# Patient Record
Sex: Female | Born: 1990 | Race: Black or African American | Hispanic: No | Marital: Single
Health system: Southern US, Community
[De-identification: ages and names within clinical notes are randomized; demographics above are authoritative.]

---

## 2010-10-19 ENCOUNTER — Ambulatory Visit: Payer: Self-pay | Admitting: Internal Medicine

## 2010-10-21 ENCOUNTER — Ambulatory Visit (HOSPITAL_COMMUNITY): Admission: RE | Admit: 2010-10-21 | Discharge: 2010-10-21 | Payer: Self-pay | Admitting: Obstetrics & Gynecology

## 2010-10-25 LAB — CBC WITH DIFFERENTIAL/PLATELET
BASO%: 0.3 % (ref 0.0–2.0)
EOS%: 2 % (ref 0.0–7.0)
MCH: 26.8 pg (ref 25.1–34.0)
MCHC: 34.3 g/dL (ref 31.5–36.0)
MCV: 78 fL — ABNORMAL LOW (ref 79.5–101.0)
MONO%: 9 % (ref 0.0–14.0)
NEUT#: 5.2 10*3/uL (ref 1.5–6.5)
RBC: 3.6 10*6/uL — ABNORMAL LOW (ref 3.70–5.45)
RDW: 15.3 % — ABNORMAL HIGH (ref 11.2–14.5)

## 2010-10-25 LAB — COMPREHENSIVE METABOLIC PANEL
AST: 18 U/L (ref 0–37)
Albumin: 3.6 g/dL (ref 3.5–5.2)
Alkaline Phosphatase: 158 U/L — ABNORMAL HIGH (ref 39–117)
Potassium: 3.7 mEq/L (ref 3.5–5.3)
Sodium: 139 mEq/L (ref 135–145)
Total Bilirubin: 0.2 mg/dL — ABNORMAL LOW (ref 0.3–1.2)
Total Protein: 6.5 g/dL (ref 6.0–8.3)

## 2010-10-27 ENCOUNTER — Encounter (INDEPENDENT_AMBULATORY_CARE_PROVIDER_SITE_OTHER): Payer: Self-pay | Admitting: *Deleted

## 2010-10-27 ENCOUNTER — Ambulatory Visit: Payer: Self-pay | Admitting: Obstetrics & Gynecology

## 2010-11-02 LAB — CBC WITH DIFFERENTIAL/PLATELET
Basophils Absolute: 0 10*3/uL (ref 0.0–0.1)
EOS%: 0 % (ref 0.0–7.0)
LYMPH%: 8.5 % — ABNORMAL LOW (ref 14.0–49.7)
MCH: 25.4 pg (ref 25.1–34.0)
MCV: 78.2 fL — ABNORMAL LOW (ref 79.5–101.0)
MONO%: 0.6 % (ref 0.0–14.0)
Platelets: 276 10*3/uL (ref 145–400)
RBC: 3.96 10*6/uL (ref 3.70–5.45)
RDW: 15.3 % — ABNORMAL HIGH (ref 11.2–14.5)

## 2010-11-03 ENCOUNTER — Ambulatory Visit: Payer: Self-pay | Admitting: Obstetrics and Gynecology

## 2010-11-03 LAB — HEPATITIS PANEL, ACUTE: Hepatitis B Surface Ag: NEGATIVE

## 2010-11-03 LAB — HIV ANTIBODY (ROUTINE TESTING W REFLEX): HIV: NONREACTIVE

## 2010-11-04 ENCOUNTER — Encounter: Payer: Self-pay | Admitting: Obstetrics & Gynecology

## 2010-11-17 ENCOUNTER — Ambulatory Visit: Payer: Self-pay | Admitting: Family Medicine

## 2010-11-17 ENCOUNTER — Encounter: Payer: Self-pay | Admitting: Physician Assistant

## 2010-11-17 LAB — CONVERTED CEMR LAB
AST: 12 units/L (ref 0–37)
Alkaline Phosphatase: 125 units/L — ABNORMAL HIGH (ref 39–117)
BUN: 13 mg/dL (ref 6–23)
Calcium: 8.7 mg/dL (ref 8.4–10.5)
Chloride: 104 meq/L (ref 96–112)
Creatinine, Ser: 0.8 mg/dL (ref 0.40–1.20)
HCT: 29.2 % — ABNORMAL LOW (ref 36.0–46.0)
Hemoglobin: 9.1 g/dL — ABNORMAL LOW (ref 12.0–15.0)
MCHC: 31.2 g/dL (ref 30.0–36.0)
MCV: 79.3 fL (ref 78.0–100.0)
RDW: 16.2 % — ABNORMAL HIGH (ref 11.5–15.5)

## 2010-11-18 ENCOUNTER — Encounter: Payer: Self-pay | Admitting: Family Medicine

## 2010-11-18 LAB — CONVERTED CEMR LAB
Collection Interval-CRCL: 24 hr
Creatinine, Urine: 85.1 mg/dL
Protein, Ur: 123 mg/24hr — ABNORMAL HIGH (ref 50–100)

## 2010-11-24 ENCOUNTER — Ambulatory Visit: Payer: Self-pay | Admitting: Internal Medicine

## 2010-11-24 ENCOUNTER — Ambulatory Visit: Payer: Self-pay | Admitting: Obstetrics & Gynecology

## 2010-11-24 ENCOUNTER — Encounter: Payer: Self-pay | Admitting: Physician Assistant

## 2010-11-24 ENCOUNTER — Observation Stay (HOSPITAL_COMMUNITY)
Admission: AD | Admit: 2010-11-24 | Discharge: 2010-11-25 | Payer: Self-pay | Source: Home / Self Care | Attending: Obstetrics and Gynecology | Admitting: Obstetrics and Gynecology

## 2010-11-28 LAB — CBC WITH DIFFERENTIAL/PLATELET
BASO%: 0.4 % (ref 0.0–2.0)
EOS%: 1.2 % (ref 0.0–7.0)
HCT: 30.9 % — ABNORMAL LOW (ref 34.8–46.6)
LYMPH%: 15 % (ref 14.0–49.7)
MCH: 25.9 pg (ref 25.1–34.0)
MCHC: 32.6 g/dL (ref 31.5–36.0)
MONO%: 6.8 % (ref 0.0–14.0)
NEUT%: 76.6 % (ref 38.4–76.8)
lymph#: 1.6 10*3/uL (ref 0.9–3.3)

## 2010-12-01 ENCOUNTER — Inpatient Hospital Stay (HOSPITAL_COMMUNITY)
Admission: AD | Admit: 2010-12-01 | Discharge: 2010-12-06 | Payer: Self-pay | Source: Home / Self Care | Attending: Obstetrics & Gynecology | Admitting: Obstetrics & Gynecology

## 2010-12-01 ENCOUNTER — Ambulatory Visit: Payer: Self-pay | Admitting: Obstetrics and Gynecology

## 2010-12-03 DIAGNOSIS — O330 Maternal care for disproportion due to deformity of maternal pelvic bones: Secondary | ICD-10-CM

## 2010-12-03 DIAGNOSIS — D689 Coagulation defect, unspecified: Secondary | ICD-10-CM

## 2010-12-03 DIAGNOSIS — O480 Post-term pregnancy: Secondary | ICD-10-CM

## 2010-12-03 DIAGNOSIS — O9912 Other diseases of the blood and blood-forming organs and certain disorders involving the immune mechanism complicating childbirth: Secondary | ICD-10-CM

## 2010-12-03 DIAGNOSIS — O139 Gestational [pregnancy-induced] hypertension without significant proteinuria, unspecified trimester: Secondary | ICD-10-CM

## 2011-02-24 NOTE — Discharge Summary (Signed)
  [name], Sara May                ACCOUNT NO.:  [id number]  MEDICAL RECORD NO.:  [id number]          PATIENT TYPE:  INP  LOCATION:  9107                          FACILITY:  [facility]  PHYSICIAN:  Tilda Burrow, M.D. DATE OF BIRTH:  03/10/1991  DATE OF ADMISSION:  12/01/2010 DATE OF DISCHARGE:                              DISCHARGE SUMMARY   REASON FOR ADMISSION:  Postdates, term pregnancy for induction of labor.  DISCHARGE DIAGNOSIS:  Term pregnancy delivered per low transverse cesarean section for cephalopelvic disproportion by Dr. Emelda Fear.  HOSPITAL COURSE:  Uneventful.  The patient is up ambulating well, taking p.o. fluids and solids well.  She has had a bowel movement.  She is emptying her bladder without difficulty.     Zerita Boers, N.M.   ______________________________ Tilda Burrow, M.D.    DL/MEDQ  D:  16/09/9603  T:  12/06/2010  Job:  [id number]  Electronically Signed by Wyvonnia Dusky N.M. on 02/19/2011 10:17:30 AM Electronically Signed by Christin Bach M.D. on 02/24/2011 10:41:02 AM

## 2011-02-27 LAB — ABO/RH: ABO/RH(D): O POS

## 2011-02-27 LAB — CBC
HCT: 32.4 % — ABNORMAL LOW (ref 36.0–46.0)
Hemoglobin: 10.1 g/dL — ABNORMAL LOW (ref 12.0–15.0)
Hemoglobin: 10.4 g/dL — ABNORMAL LOW (ref 12.0–15.0)
MCH: 24.7 pg — ABNORMAL LOW (ref 26.0–34.0)
MCHC: 31.4 g/dL (ref 30.0–36.0)
MCHC: 32.1 g/dL (ref 30.0–36.0)
Platelets: 228 10*3/uL (ref 150–400)
Platelets: 247 10*3/uL (ref 150–400)
RBC: 3.37 MIL/uL — ABNORMAL LOW (ref 3.87–5.11)
RBC: 4.16 MIL/uL (ref 3.87–5.11)
RDW: 16.2 % — ABNORMAL HIGH (ref 11.5–15.5)
RDW: 16.4 % — ABNORMAL HIGH (ref 11.5–15.5)
WBC: 9.1 10*3/uL (ref 4.0–10.5)

## 2011-02-27 LAB — TYPE AND SCREEN
ABO/RH(D): O POS
Antibody Screen: NEGATIVE

## 2011-02-27 LAB — RUBELLA SCREEN: Rubella: 11.6 IU/mL — ABNORMAL HIGH

## 2011-02-27 LAB — RPR: RPR Ser Ql: NONREACTIVE

## 2011-02-28 LAB — CBC
HCT: 33.2 % — ABNORMAL LOW (ref 36.0–46.0)
MCHC: 32.3 g/dL (ref 30.0–36.0)
MCV: 80.5 fL (ref 78.0–100.0)
Platelets: 247 10*3/uL (ref 150–400)
RDW: 16.9 % — ABNORMAL HIGH (ref 11.5–15.5)

## 2011-02-28 LAB — COMPREHENSIVE METABOLIC PANEL
Albumin: 2.9 g/dL — ABNORMAL LOW (ref 3.5–5.2)
BUN: 7 mg/dL (ref 6–23)
Calcium: 9.7 mg/dL (ref 8.4–10.5)
Creatinine, Ser: 0.81 mg/dL (ref 0.4–1.2)
Glucose, Bld: 78 mg/dL (ref 70–99)
Total Protein: 6.8 g/dL (ref 6.0–8.3)

## 2011-02-28 LAB — POCT URINALYSIS DIPSTICK
Bilirubin Urine: NEGATIVE
Bilirubin Urine: NEGATIVE
Bilirubin Urine: NEGATIVE
Glucose, UA: 100 mg/dL — AB
Glucose, UA: NEGATIVE mg/dL
Hgb urine dipstick: NEGATIVE
Ketones, ur: NEGATIVE mg/dL
Ketones, ur: NEGATIVE mg/dL
Ketones, ur: NEGATIVE mg/dL
Protein, ur: NEGATIVE mg/dL
Protein, ur: NEGATIVE mg/dL
Specific Gravity, Urine: 1.015 (ref 1.005–1.030)
Specific Gravity, Urine: 1.02 (ref 1.005–1.030)
Specific Gravity, Urine: 1.015 (ref 1.005–1.030)
Specific Gravity, Urine: 1.02 (ref 1.005–1.030)
Urobilinogen, UA: 0.2 mg/dL (ref 0.0–1.0)
pH: 7.5 (ref 5.0–8.0)

## 2011-02-28 LAB — CREATININE CLEARANCE, URINE, 24 HOUR
Collection Interval-CRCL: 24 hours
Creatinine, 24H Ur: 1994 mg/d — ABNORMAL HIGH (ref 700–1800)
Creatinine, Urine: 40.7 mg/dL
Creatinine: 0.81 mg/dL (ref 0.4–1.2)
Urine Total Volume-CRCL: 4900 mL

## 2011-02-28 LAB — URINE MICROSCOPIC-ADD ON

## 2011-02-28 LAB — URINALYSIS, ROUTINE W REFLEX MICROSCOPIC
Glucose, UA: NEGATIVE mg/dL
Protein, ur: NEGATIVE mg/dL
Specific Gravity, Urine: <1.005 — ABNORMAL LOW (ref 1.005–1.030)
Urobilinogen, UA: 0.2 mg/dL (ref 0.0–1.0)

## 2011-02-28 LAB — PROTEIN, URINE, 24 HOUR: Collection Interval-UPROT: 24 hours

## 2012-08-04 IMAGING — US US OB COMP +14 WK
1 series · 14 of 28 positions shown · non-contrast
Comparison: none

OBSTETRICAL ULTRASOUND:
 This ultrasound exam was performed in the [HOSPITAL] Ultrasound Department.  The OB US report was generated in the AS system, and faxed to the ordering physician.  This report is also available in [HOSPITAL]?s [employer] and in [REDACTED] PACS.

[Series 1: us ob detail +14 wk · 0.36mm/px · 14 of 47 slices shown]
[im 2/47]
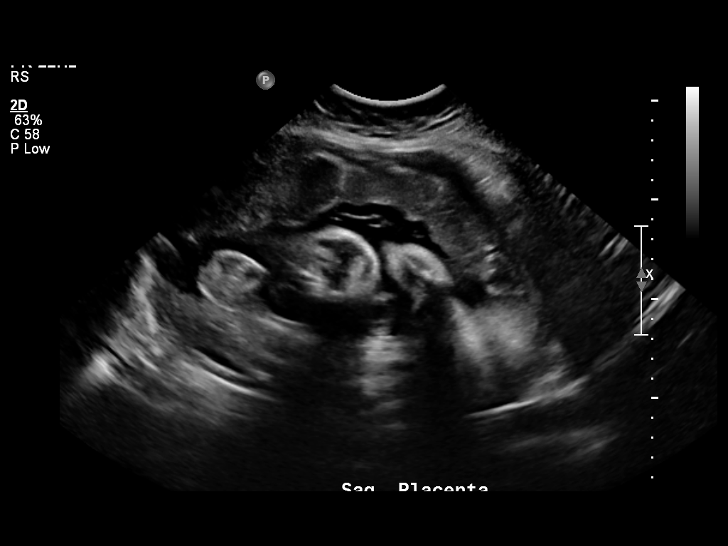
[im 6/47]
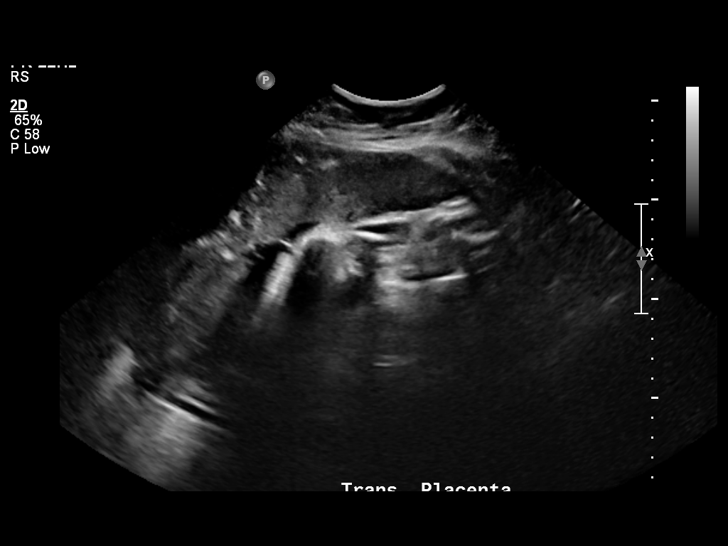
[im 9/47]
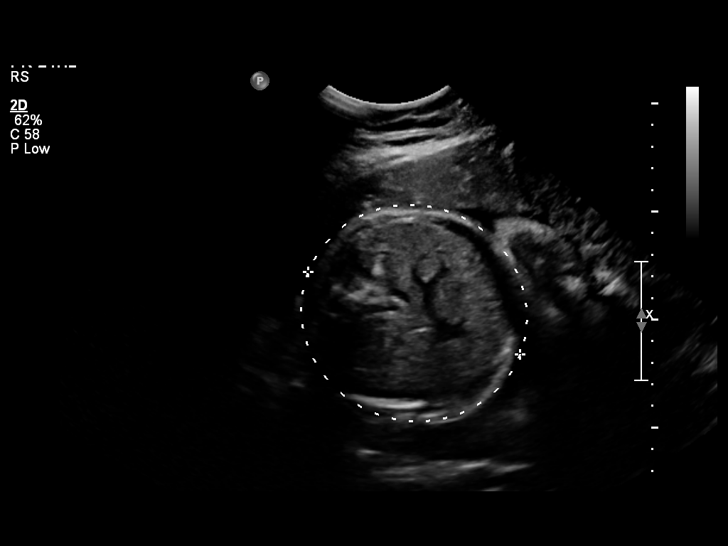
[im 12/47]
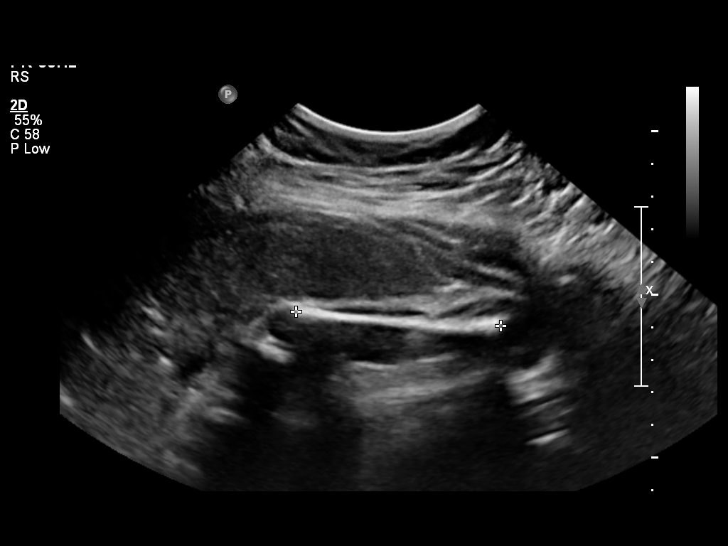
[im 16/47]
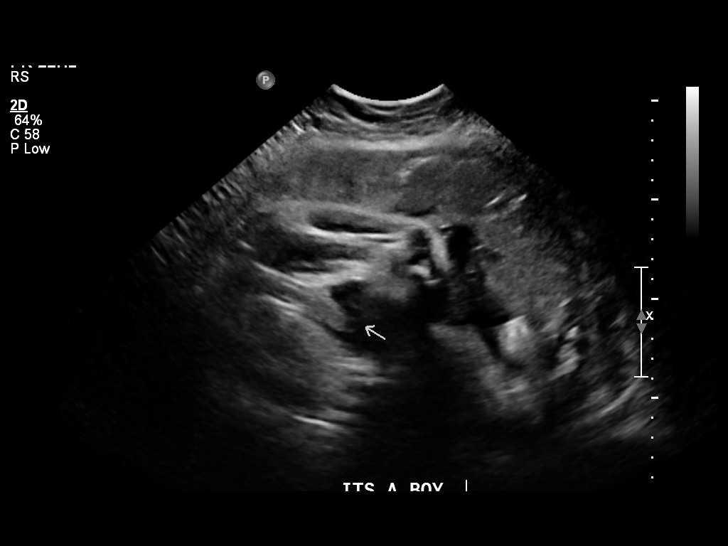
[im 19/47]
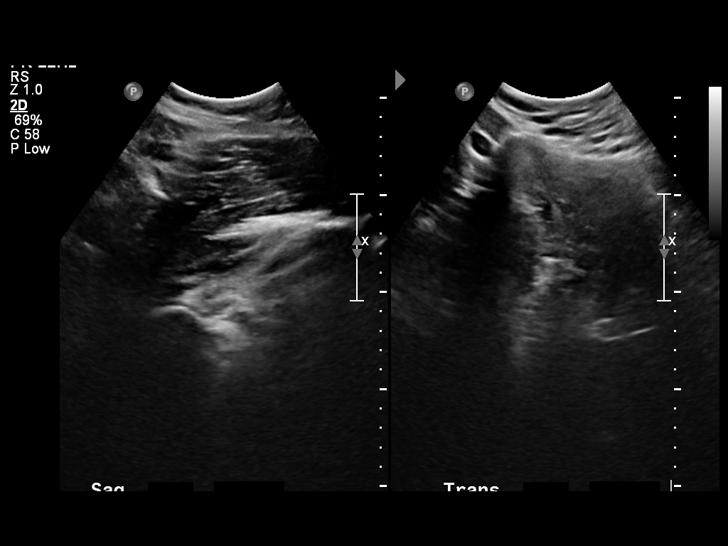
[im 23/47]
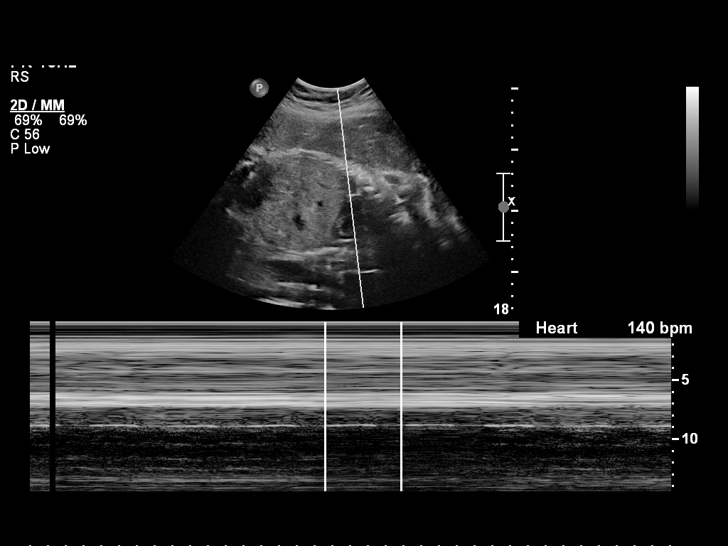
[im 26/47]
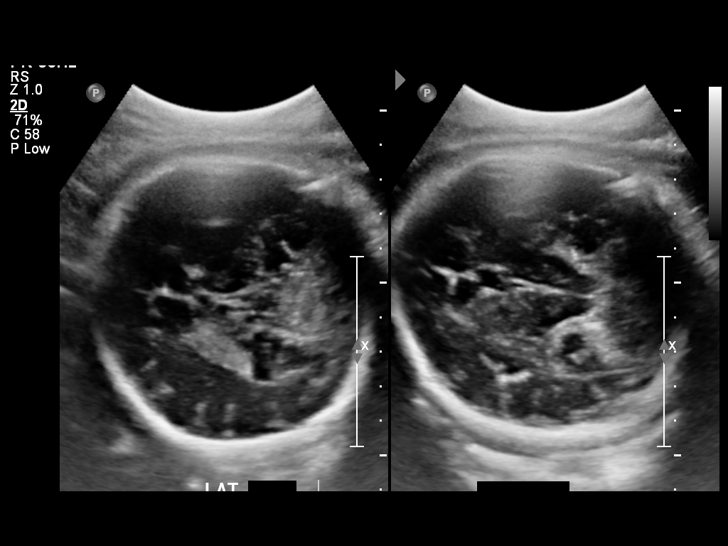
[im 29/47]
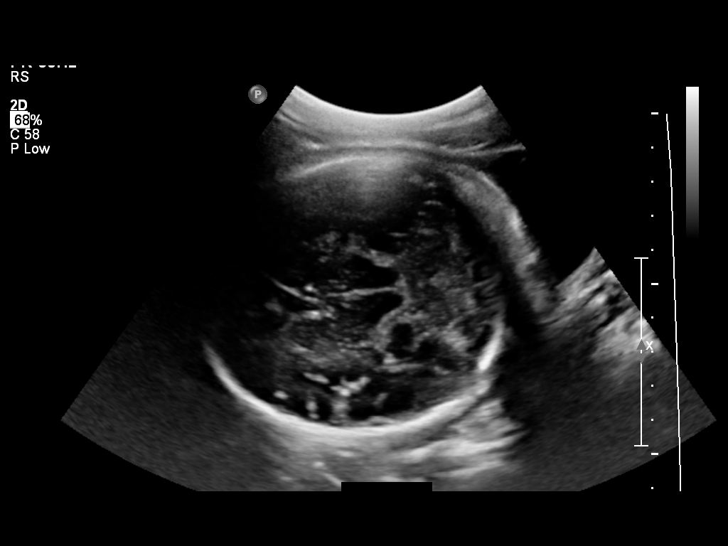
[im 33/47]
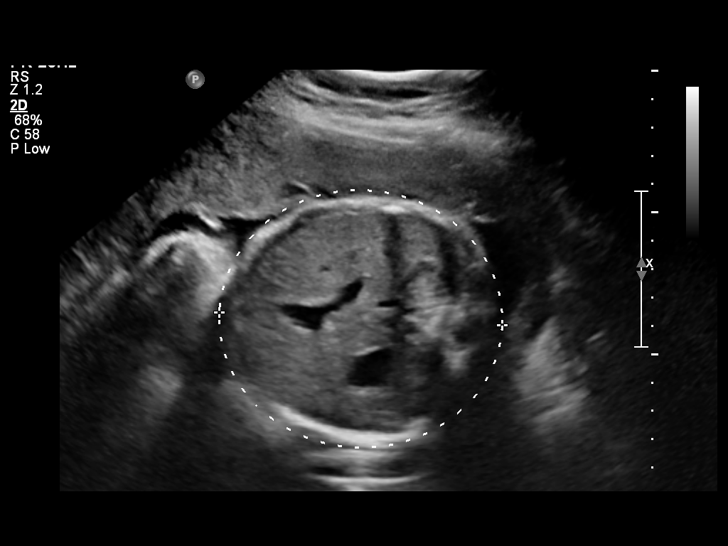
[im 36/47]
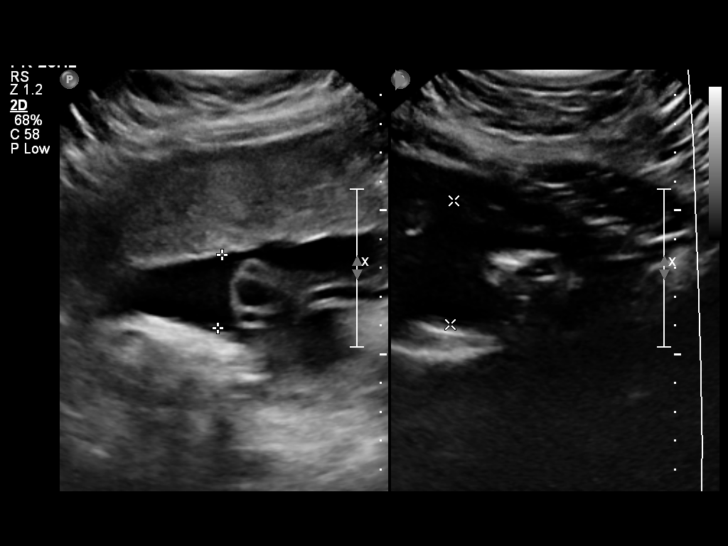
[im 40/47]
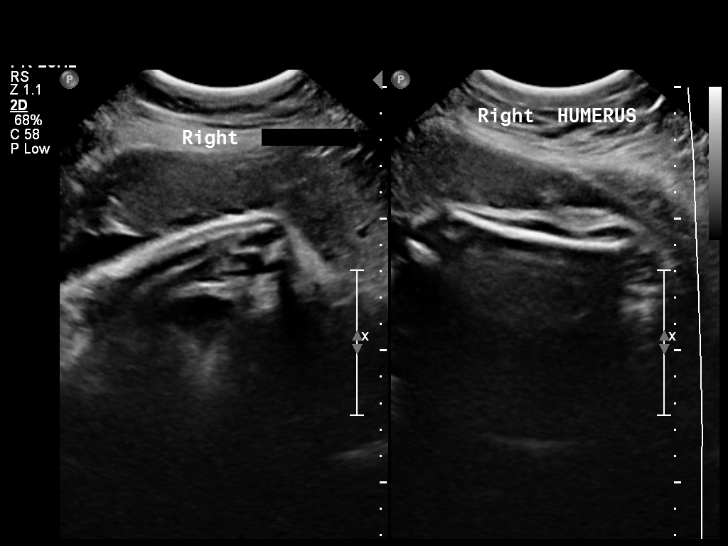
[im 43/47]
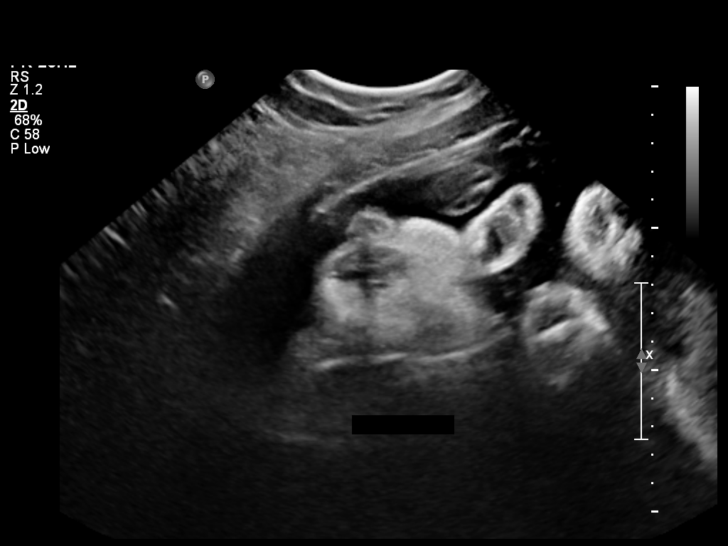
[im 47/47]
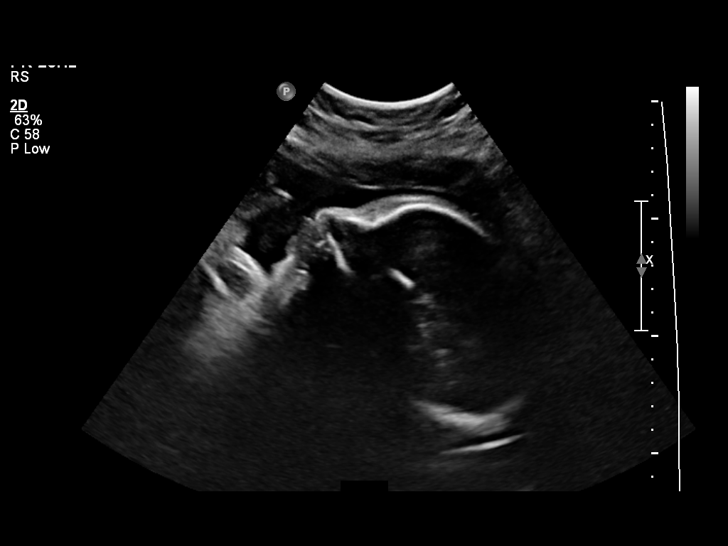

[14 of 28 positions shown; findings below may reference images not displayed]

IMPRESSION: See [facility] Obstetric US report.

## 2013-12-01 LAB — OB RESULTS CONSOLE HIV ANTIBODY (ROUTINE TESTING): HIV: NONREACTIVE

## 2013-12-01 LAB — OB RESULTS CONSOLE GC/CHLAMYDIA
Chlamydia: NEGATIVE
GC PROBE AMP, GENITAL: NEGATIVE

## 2013-12-01 LAB — OB RESULTS CONSOLE RPR: RPR: NONREACTIVE

## 2013-12-01 LAB — OB RESULTS CONSOLE VARICELLA ZOSTER ANTIBODY, IGG: VARICELLA IGG: IMMUNE

## 2013-12-01 LAB — OB RESULTS CONSOLE ABO/RH: RH Type: POSITIVE

## 2013-12-01 LAB — OB RESULTS CONSOLE HEPATITIS B SURFACE ANTIGEN: Hepatitis B Surface Ag: NEGATIVE

## 2013-12-01 LAB — OB RESULTS CONSOLE ANTIBODY SCREEN: Antibody Screen: NEGATIVE

## 2013-12-01 LAB — CHG CYTOPATH CERV/VAG THIN LAYER
Hemoglobin, Elp: NORMAL
Pap Smear: NEGATIVE

## 2013-12-01 LAB — OB RESULTS CONSOLE HGB/HCT, BLOOD: Hemoglobin: 9.8 g/dL

## 2013-12-01 LAB — OB RESULTS CONSOLE RUBELLA ANTIBODY, IGM: Rubella: IMMUNE

## 2014-01-15 DIAGNOSIS — O34219 Maternal care for unspecified type scar from previous cesarean delivery: Secondary | ICD-10-CM

## 2014-01-15 DIAGNOSIS — F5089 Other specified eating disorder: Secondary | ICD-10-CM

## 2014-01-15 DIAGNOSIS — Z862 Personal history of diseases of the blood and blood-forming organs and certain disorders involving the immune mechanism: Secondary | ICD-10-CM

## 2014-01-15 NOTE — Assessment & Plan Note (Signed)
Follow up ultrasound exam at 30 weeks reccomended.

## 2014-01-15 NOTE — Progress Notes (Signed)
Prenatal genetic screen done at [facility] 12/01/13. Negative for any diseases. Also done at [facility]:  Infection history- positive for trich, chlamydia in past,  Psychosocial screening  Negative,  Violence screen negative,, Nutrition screen.

## 2014-01-19 ENCOUNTER — Encounter: Payer: Self-pay | Admitting: *Deleted

## 2014-02-05 ENCOUNTER — Encounter: Payer: Self-pay | Admitting: Family Medicine

## 2014-11-20 ENCOUNTER — Encounter (HOSPITAL_COMMUNITY): Payer: Self-pay | Admitting: *Deleted
